# Patient Record
Sex: Male | Born: 1990 | Race: White | Hispanic: No | Marital: Single | Smoking: Former smoker
Health system: Southern US, Community
[De-identification: ages and names within clinical notes are randomized; demographics above are authoritative.]

## PROBLEM LIST (undated history)

## (undated) DIAGNOSIS — F329 Major depressive disorder, single episode, unspecified: Secondary | ICD-10-CM

## (undated) DIAGNOSIS — F419 Anxiety disorder, unspecified: Secondary | ICD-10-CM

## (undated) DIAGNOSIS — F1910 Other psychoactive substance abuse, uncomplicated: Secondary | ICD-10-CM

## (undated) DIAGNOSIS — F32A Depression, unspecified: Secondary | ICD-10-CM

## (undated) HISTORY — PX: DENTAL SURGERY: SHX609

## (undated) HISTORY — DX: Anxiety disorder, unspecified: F41.9

## (undated) HISTORY — DX: Other psychoactive substance abuse, uncomplicated: F19.10

## (undated) HISTORY — DX: Depression, unspecified: F32.A

## (undated) HISTORY — DX: Major depressive disorder, single episode, unspecified: F32.9

---
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
## 2010-03-04 ENCOUNTER — Encounter: Payer: Self-pay | Admitting: Family Medicine

## 2010-12-14 NOTE — Letter (Signed)
Summary: Immunization Form  Immunization Form   Imported By: Lanelle Bal 03/16/2010 11:46:52  _____________________________________________________________________  External Attachment:    Type:   Image     Comment:   External Document

## 2010-12-14 NOTE — Miscellaneous (Signed)
Summary: Vaccine Records  Vaccine Records   Imported By: Lanelle Bal 03/16/2010 11:45:44  _____________________________________________________________________  External Attachment:    Type:   Image     Comment:   External Document

## 2011-11-18 ENCOUNTER — Encounter: Payer: Self-pay | Admitting: Emergency Medicine

## 2011-11-18 ENCOUNTER — Emergency Department (HOSPITAL_COMMUNITY)
Admission: EM | Admit: 2011-11-18 | Discharge: 2011-11-18 | Disposition: A | Discharge status: Home / Self Care | Payer: BC Managed Care – PPO | Attending: Emergency Medicine | Admitting: Emergency Medicine

## 2011-11-18 DIAGNOSIS — IMO0002 Reserved for concepts with insufficient information to code with codable children: Secondary | ICD-10-CM

## 2011-11-18 DIAGNOSIS — Y92009 Unspecified place in unspecified non-institutional (private) residence as the place of occurrence of the external cause: Secondary | ICD-10-CM | POA: Insufficient documentation

## 2011-11-18 DIAGNOSIS — S0180XA Unspecified open wound of other part of head, initial encounter: Secondary | ICD-10-CM | POA: Insufficient documentation

## 2011-11-18 MED ORDER — IBUPROFEN 600 MG PO TABS: 600.0000 mg | ORAL_TABLET | Freq: Four times a day (QID) | ORAL | Status: AC | PRN

## 2011-11-18 MED ORDER — TETANUS-DIPHTH-ACELL PERTUSSIS 5-2.5-18.5 LF-MCG/0.5 IM SUSP
0.5000 mL | Freq: Once | INTRAMUSCULAR | Status: AC
  Administered 2011-11-18: 0.5 mL via INTRAMUSCULAR
  Filled 2011-11-18: qty 0.5

## 2011-11-18 NOTE — ED Notes (Signed)
LACERATION CLEANED WITH NS AND DRY DRESSING APPLIED AT TRIAGE.

## 2011-11-18 NOTE — ED Notes (Signed)
PT. PRESENTS WITH LACERATION AT UPPER FOREHEAD APPROX. 1 INCH , PT. UNABLE TO PROVIDE INFORMATION ON INCIDENT , PT. WITH [employer] OFFICER UNDER CUSTODY.

## 2011-11-18 NOTE — ED Provider Notes (Signed)
History     CSN: [id number]  Arrival date & time 11/18/11  0865   First MD Initiated Contact with Patient 11/18/11 [phone]      Chief Complaint  Patient presents with  . Head Laceration    (Consider location/radiation/quality/duration/timing/severity/associated sxs/prior treatment) Patient is a 21 y.o. male presenting with scalp laceration. The history is provided by the patient.  Head Laceration This is a new problem. The current episode started 1 to 2 hours ago. The problem occurs constantly. The problem has not changed since onset.Pertinent negatives include no chest pain, no abdominal pain, no headaches and no shortness of breath. The symptoms are aggravated by nothing. The symptoms are relieved by nothing. He has tried nothing for the symptoms. The treatment provided no relief.   patient brought in under custody of police. Patient tells me he was at home when the police arrived and arrested him. His friend was also arrested and was resisting arrest and was tackled by police allegedly. During this event the patient was also tackled and he believes at some point this is what caused laceration to his for head. He denies any LOC or neck pain. He denies any other pain injury or trauma. Last tetanus was about 5 years ago. Bleeding controlled prior to arrival. Her troubles vision. No nausea vomiting. No foreign body sensation.  History reviewed. No pertinent past medical history.  History reviewed. No pertinent past surgical history.  No family history on file.  History  Substance Use Topics  . Smoking status: Current Everyday Smoker  . Smokeless tobacco: Not on file  . Alcohol Use: No      Review of Systems  Constitutional: Negative for fever and chills.  HENT: Negative for neck pain and neck stiffness.   Eyes: Negative for pain.  Respiratory: Negative for shortness of breath.   Cardiovascular: Negative for chest pain.  Gastrointestinal: Negative for abdominal pain.    Genitourinary: Negative for dysuria.  Musculoskeletal: Negative for back pain.  Skin: Positive for wound. Negative for rash.  Neurological: Negative for headaches.  All other systems reviewed and are negative.    Allergies  Review of patient's allergies indicates no known allergies.  Home Medications  No current outpatient prescriptions on file.  BP 134/74  Pulse 80  Temp(Src) 97.2 F (36.2 C) (Oral)  Resp 18  SpO2 98%  Physical Exam  Constitutional: He is oriented to person, place, and time. He appears well-developed and well-nourished.  HENT:  Head: Normocephalic.       3 cm U-shaped laceration midline just below the hairline. Full-thickness. No active bleeding. No foreign bodies visualized. No underlying bony tenderness or deformity. No associated epistaxis or septal hematoma. No trismus. No midface instability.  Eyes: Conjunctivae and EOM are normal. Pupils are equal, round, and reactive to light.  Neck: Full passive range of motion without pain. Neck supple. No thyromegaly present.       No cervical tenderness or deformity.  Cardiovascular: Normal rate, regular rhythm, S1 normal, S2 normal and intact distal pulses.   Pulmonary/Chest: Effort normal and breath sounds normal.  Abdominal: Soft. Bowel sounds are normal. There is no tenderness. There is no CVA tenderness.  Musculoskeletal: Normal range of motion.  Neurological: He is alert and oriented to person, place, and time. He has normal strength and normal reflexes. No cranial nerve deficit or sensory deficit. He displays a negative Romberg sign. GCS eye subscore is 4. GCS verbal subscore is 5. GCS motor subscore is 6.  Normal Gait  Skin: Skin is warm and dry. No rash noted. No cyanosis. Nails show no clubbing.  Psychiatric: He has a normal mood and affect. His speech is normal and behavior is normal.    ED Course  LACERATION REPAIR Date/Time: 11/18/2011 6:28 AM Performed by: Sunnie Nielsen Authorized by: Sunnie Nielsen Consent: Verbal consent obtained. Risks and benefits: risks, benefits and alternatives were discussed Consent given by: patient Patient understanding: patient states understanding of the procedure being performed Patient consent: the patient's understanding of the procedure matches consent given Procedure consent: procedure consent matches procedure scheduled Patient identity confirmed: verbally with patient Time out: Immediately prior to procedure a "time out" was called to verify the correct patient, procedure, equipment, support staff and site/side marked as required. Body area: head/neck Location details: forehead Laceration length: 3 cm Foreign bodies: no foreign bodies Tendon involvement: none Nerve involvement: none Vascular damage: no Anesthesia: local infiltration Local anesthetic: lidocaine 1% without epinephrine Anesthetic total: 2 ml Preparation: Patient was prepped and draped in the usual sterile fashion. Irrigation solution: saline Irrigation method: syringe Amount of cleaning: extensive Debridement: none Degree of undermining: none Skin closure: 6-0 Prolene Number of sutures: 3 Technique: simple Approximation: close Approximation difficulty: simple Dressing: 4x4 sterile gauze and antibiotic ointment Patient tolerance: Patient tolerated the procedure well with no immediate complications. Comments: Tetanus updated. Wound edges well approximated.   (including critical care time)  lac repair as above   MDM   Head wound. No indication for CT brain.   plan suture removal 7 days.  Scar and infection precautions verbalized is understood.   Sunnie Nielsen, MD 11/18/11 [phone]

## 2012-03-23 ENCOUNTER — Emergency Department (HOSPITAL_COMMUNITY): Payer: BC Managed Care – PPO

## 2012-03-23 ENCOUNTER — Emergency Department (HOSPITAL_COMMUNITY)
Admission: EM | Admit: 2012-03-23 | Discharge: 2012-03-23 | Disposition: A | Discharge status: Home / Self Care | Payer: BC Managed Care – PPO | Attending: Emergency Medicine | Admitting: Emergency Medicine

## 2012-03-23 DIAGNOSIS — R4182 Altered mental status, unspecified: Secondary | ICD-10-CM | POA: Insufficient documentation

## 2012-03-23 DIAGNOSIS — F1010 Alcohol abuse, uncomplicated: Secondary | ICD-10-CM | POA: Insufficient documentation

## 2012-03-23 DIAGNOSIS — F10929 Alcohol use, unspecified with intoxication, unspecified: Secondary | ICD-10-CM

## 2012-03-23 DIAGNOSIS — R112 Nausea with vomiting, unspecified: Secondary | ICD-10-CM | POA: Insufficient documentation

## 2012-03-23 DIAGNOSIS — F17200 Nicotine dependence, unspecified, uncomplicated: Secondary | ICD-10-CM | POA: Insufficient documentation

## 2012-03-23 LAB — POCT I-STAT, CHEM 8
Calcium, Ion: 1.21 mmol/L (ref 1.12–1.32)
Chloride: 104 mEq/L (ref 96–112)
HCT: 43 % (ref 39.0–52.0)
Hemoglobin: 14.6 g/dL (ref 13.0–17.0)
Potassium: 3.5 mEq/L (ref 3.5–5.1)

## 2012-03-23 LAB — RAPID URINE DRUG SCREEN, HOSP PERFORMED
Barbiturates: NOT DETECTED
Cocaine: NOT DETECTED
Tetrahydrocannabinol: POSITIVE — AB

## 2012-03-23 MED ORDER — ONDANSETRON HCL 4 MG/2ML IJ SOLN
4.0000 mg | Freq: Once | INTRAMUSCULAR | Status: AC
  Administered 2012-03-23: 4 mg via INTRAVENOUS
  Filled 2012-03-23: qty 2

## 2012-03-23 MED ORDER — ONDANSETRON HCL 4 MG PO TABS: 4.0000 mg | ORAL_TABLET | Freq: Four times a day (QID) | ORAL | Status: AC

## 2012-03-23 MED ORDER — SODIUM CHLORIDE 0.9 % IV BOLUS (SEPSIS)
500.0000 mL | Freq: Once | INTRAVENOUS | Status: AC
  Administered 2012-03-23: 500 mL via INTRAVENOUS

## 2012-03-23 MED ORDER — AMMONIA AROMATIC IN INHA
0.3000 mL | Freq: Once | RESPIRATORY_TRACT | Status: AC
  Administered 2012-03-23: 0.3 mL via RESPIRATORY_TRACT
  Filled 2012-03-23: qty 10

## 2012-03-23 NOTE — ED Notes (Addendum)
Patient briefly grimaced, but he did not withdraw from the ammonia inhalant.

## 2012-03-23 NOTE — ED Notes (Signed)
Per E.M.S., the patient was alert on transport.  At this time he is not alert, and he only localizes pain associated with sternal rubs.

## 2012-03-23 NOTE — ED Provider Notes (Signed)
Medical screening examination/treatment/procedure(s) were performed by non-physician practitioner and as supervising physician I was immediately available for consultation/collaboration.   Glynn Octave, MD 03/23/12 [phone]

## 2012-03-23 NOTE — ED Notes (Signed)
Patient transported to CT 

## 2012-03-23 NOTE — ED Notes (Signed)
The patient advised E.M.S. that he started drinking alcohol at 2300.  The patient stated that he didn't know how much alcohol he ingested.  E.M.S. reported vomiting x 1 en route.  Patient smells of emesis.

## 2012-03-23 NOTE — Discharge Instructions (Signed)
Alcohol Intoxication  You have alcohol intoxication when the amount of alcohol that you have consumed has impaired your ability to mentally and physically function. There are a variety of factors that contribute to the level at which alcohol intoxication can occur, such as age, gender, weight, frequency of alcohol consumption, medication use, and the presence of other medical conditions, such as diabetes, seizures, or heart conditions.  The blood alcohol level test measures the concentration of alcohol in your blood. In most states, your blood alcohol level must be lower than 80 mg/dL (0.08%) to legally drive. However, many dangerous effects of alcohol can occur at much lower levels.  Alcohol directly impairs the normal chemical activity of the brain and is said to be a chemical depressant. Alcohol can cause drowsiness, stupor, respiratory failure, and coma. Other physical effects can include headache, vomiting, vomiting of blood, abdominal pain, a fast heartbeat, difficulty breathing, anxiety, and amnesia. Alcohol intoxication can also lead to dangerous and life-threatening activities, such as fighting, dangerous operation of vehicles or heavy machinery, and risky sexual behavior.  Alcohol can be especially dangerous when taken with other drugs. Some of these drugs are:   Sedatives.   Painkillers.   Marijuana.   Tranquilizers.   Antihistamines.   Muscle relaxants.   Seizure medicine.  Many of the effects of acute alcohol intoxication are temporary. However, repeated alcohol intoxication can lead to severe medical illnesses. If you have alcohol intoxication, you should:   Stay hydrated. Drink enough water and fluids to keep your urine clear or pale yellow. Avoid excessive caffeine because this can further lead to dehydration.   Eat a healthy diet. You may have residual nausea, headache, and loss of appetite, but it is still important that you maintain good nutrition. You can start with clear  liquids.   Take nonsteroidal anti-inflammatory medications as needed for headaches, but make sure to do so with small meals. You should avoid acetaminophen for several days after having alcohol intoxication because the combination of alcohol and acetaminophen can be toxic to your liver.  If you have frequent alcohol intoxication, ask your friends and family if they think you have a drinking problem. For further help, contact:   Your caregiver.   Alcoholics Anonymous (AA).   A drug or alcohol rehabilitation program.  SEEK MEDICAL CARE IF:    You have persistent vomiting.   You have persistent pain in any part of your body.   You do not feel better after a few days.  SEEK IMMEDIATE MEDICAL CARE IF:    You become shaky or tremble when you try to stop drinking.   You shake uncontrollably (seizure).   You throw up (vomit) blood. This may be bright red or it may look like black coffee grounds.   You have blood in the stool. This may be bright red or appear as a black, tarry, bad smelling stool.   You become lightheaded or faint.  ANY OF THESE SYMPTOMS MAY REPRESENT A SERIOUS PROBLEM THAT IS AN EMERGENCY. Do not wait to see if the symptoms will go away. Get medical help right away. Call your local emergency services (911 in [place] DO NOT drive yourself to the hospital.  MAKE SURE YOU:    Understand these instructions.   Will watch your condition.   Will get help right away if you are not doing well or get worse.  Document Released: 08/10/2005 Document Revised: 10/20/2011 Document Reviewed: 04/19/2010  [employer] Patient Information 2012 [employer].

## 2012-03-23 NOTE — ED Provider Notes (Signed)
History     CSN: [id number]  Arrival date & time 03/23/12  0551   None     Chief Complaint  Patient presents with  . Altered Mental Status  . Emesis  . Alcohol Intoxication    (Consider location/radiation/quality/duration/timing/severity/associated sxs/prior treatment) Patient is a 21 y.o. male presenting with intoxication. The history is provided by the EMS personnel.  Alcohol Intoxication This is a new problem. The current episode started yesterday. The problem occurs constantly. Associated symptoms comments: Brought in by EMS, unable to give history secondary to significant alcohol intoxication. Per EMS, patient vomiting after drinking last night. No known or reported injuries..    No past medical history on file.  No past surgical history on file.  No family history on file.  History  Substance Use Topics  . Smoking status: Current Everyday Smoker  . Smokeless tobacco: Not on file  . Alcohol Use: No      Review of Systems  Unable to perform ROS   Allergies  Review of patient's allergies indicates no known allergies.  Home Medications  No current outpatient prescriptions on file.  BP 117/68  Pulse 69  Temp(Src) 98 F (36.7 C) (Axillary)  Resp 18  Ht 5\' 8"  (1.727 m)  Wt 140 lb (63.504 kg)  BMI 21.29 kg/m2  SpO2 99%  Physical Exam  Constitutional: He appears well-developed and well-nourished.  Eyes: Pupils are equal, round, and reactive to light.  Cardiovascular: Normal rate.   No murmur heard. Pulmonary/Chest: No respiratory distress. He has no wheezes. He has no rales.  Abdominal: Soft.  Neurological:       Patient responds to sternal rub, speaks incoherently, then sleeps again. VSS.    ED Course  Procedures (including critical care time)   Labs Reviewed  ETHANOL  URINE RAPID DRUG SCREEN (HOSP PERFORMED)   No results found.  Results for orders placed during the hospital encounter of 03/23/12  ETHANOL      Component Value Range   Alcohol, Ethyl (B) 206 (*) 0 - 11 (mg/dL)  URINE RAPID DRUG SCREEN (HOSP PERFORMED)      Component Value Range   Opiates NONE DETECTED  NONE DETECTED    Cocaine NONE DETECTED  NONE DETECTED    Benzodiazepines POSITIVE (*) NONE DETECTED    Amphetamines NONE DETECTED  NONE DETECTED    Tetrahydrocannabinol POSITIVE (*) NONE DETECTED    Barbiturates NONE DETECTED  NONE DETECTED   POCT I-STAT, CHEM 8      Component Value Range   Sodium 142  135 - 145 (mEq/L)   Potassium 3.5  3.5 - 5.1 (mEq/L)   Chloride 104  96 - 112 (mEq/L)   BUN 10  6 - 23 (mg/dL)   Creatinine, Ser 1.19  0.50 - 1.35 (mg/dL)   Glucose, Bld 87  70 - 99 (mg/dL)   Calcium, Ion 1.47  8.29 - 1.32 (mmol/L)   TCO2 27  0 - 100 (mmol/L)   Hemoglobin 14.6  13.0 - 17.0 (g/dL)   HCT 56.2  13.0 - 86.5 (%)   Ct Head Wo Contrast  03/23/2012  *RADIOLOGY REPORT*  Clinical Data: Altered mental status.  Emesis.  CT HEAD WITHOUT CONTRAST  Technique:  Contiguous axial images were obtained from the base of the skull through the vertex without contrast.  Comparison: None.  Findings: No acute cortical infarct, hemorrhage, or mass lesion is present.  The ventricles are of normal size.  No significant extra- axial fluid collection is present.  The  paranasal sinuses and mastoid air cells are clear.  The osseous skull is intact.  IMPRESSION: Negative CT of the head.  Original Report Authenticated By: Jamesetta Orleans. MATTERN, M.D.   No diagnosis found.  1. Alcohol intoxication   MDM  Patient now waking to verbal stimuli after initially only with painful stimuli. VSS - will continue to monitor.  Multiple re-evaluations: Patient always wakens to verbal command, gradually more and more awake. Vital signs stable for duration of visit. Father at bedside. Patient (at 1245 p.m.) is fully awake and oriented. Will discharge home.        Rodena Medin, PA-C 03/23/12 1250

## 2014-10-26 ENCOUNTER — Emergency Department (HOSPITAL_COMMUNITY): Payer: BC Managed Care – PPO

## 2014-10-26 ENCOUNTER — Emergency Department (HOSPITAL_COMMUNITY)
Admission: EM | Admit: 2014-10-26 | Discharge: 2014-10-26 | Disposition: A | Discharge status: Home / Self Care | Payer: BC Managed Care – PPO | Attending: Emergency Medicine | Admitting: Emergency Medicine

## 2014-10-26 ENCOUNTER — Encounter (HOSPITAL_COMMUNITY): Payer: Self-pay

## 2014-10-26 DIAGNOSIS — S61512A Laceration without foreign body of left wrist, initial encounter: Secondary | ICD-10-CM | POA: Diagnosis not present

## 2014-10-26 DIAGNOSIS — Y998 Other external cause status: Secondary | ICD-10-CM | POA: Insufficient documentation

## 2014-10-26 DIAGNOSIS — Z720 Tobacco use: Secondary | ICD-10-CM | POA: Insufficient documentation

## 2014-10-26 DIAGNOSIS — Y9389 Activity, other specified: Secondary | ICD-10-CM | POA: Diagnosis not present

## 2014-10-26 DIAGNOSIS — S50311A Abrasion of right elbow, initial encounter: Secondary | ICD-10-CM | POA: Diagnosis not present

## 2014-10-26 DIAGNOSIS — S8392XA Sprain of unspecified site of left knee, initial encounter: Secondary | ICD-10-CM | POA: Diagnosis not present

## 2014-10-26 DIAGNOSIS — T1490XA Injury, unspecified, initial encounter: Secondary | ICD-10-CM

## 2014-10-26 DIAGNOSIS — Z23 Encounter for immunization: Secondary | ICD-10-CM | POA: Insufficient documentation

## 2014-10-26 DIAGNOSIS — F10920 Alcohol use, unspecified with intoxication, uncomplicated: Secondary | ICD-10-CM

## 2014-10-26 DIAGNOSIS — F10120 Alcohol abuse with intoxication, uncomplicated: Secondary | ICD-10-CM | POA: Diagnosis not present

## 2014-10-26 DIAGNOSIS — Y92410 Unspecified street and highway as the place of occurrence of the external cause: Secondary | ICD-10-CM | POA: Insufficient documentation

## 2014-10-26 LAB — COMPREHENSIVE METABOLIC PANEL
ALK PHOS: 66 U/L (ref 39–117)
ALT: 43 U/L (ref 0–53)
ANION GAP: 14 (ref 5–15)
AST: 55 U/L — ABNORMAL HIGH (ref 0–37)
Albumin: 4.1 g/dL (ref 3.5–5.2)
BILIRUBIN TOTAL: 0.3 mg/dL (ref 0.3–1.2)
BUN: 9 mg/dL (ref 6–23)
CHLORIDE: 102 meq/L (ref 96–112)
CO2: 24 mEq/L (ref 19–32)
CREATININE: 0.92 mg/dL (ref 0.50–1.35)
Calcium: 8.9 mg/dL (ref 8.4–10.5)
GFR calc non Af Amer: >90 mL/min (ref >90)
GLUCOSE: 94 mg/dL (ref 70–99)
POTASSIUM: 4.1 meq/L (ref 3.7–5.3)
Sodium: 140 mEq/L (ref 137–147)
TOTAL PROTEIN: 6.8 g/dL (ref 6.0–8.3)

## 2014-10-26 LAB — PROTIME-INR
INR: 0.96 (ref 0.00–1.49)
PROTHROMBIN TIME: 12.9 s (ref 11.6–15.2)

## 2014-10-26 LAB — CBC
HCT: 43.3 % (ref 39.0–52.0)
Hemoglobin: 14.2 g/dL (ref 13.0–17.0)
MCH: 29.6 pg (ref 26.0–34.0)
MCHC: 32.8 g/dL (ref 30.0–36.0)
MCV: 90.2 fL (ref 78.0–100.0)
PLATELETS: 235 10*3/uL (ref 150–400)
RBC: 4.8 MIL/uL (ref 4.22–5.81)
RDW: 13.3 % (ref 11.5–15.5)
WBC: 9.5 10*3/uL (ref 4.0–10.5)

## 2014-10-26 LAB — SAMPLE TO BLOOD BANK

## 2014-10-26 LAB — ETHANOL: ALCOHOL ETHYL (B): 278 mg/dL — AB (ref 0–11)

## 2014-10-26 MED ORDER — TRAMADOL HCL 50 MG PO TABS: 50.0000 mg | ORAL_TABLET | Freq: Four times a day (QID) | ORAL | Status: DC | PRN

## 2014-10-26 MED ORDER — TETANUS-DIPHTH-ACELL PERTUSSIS 5-2.5-18.5 LF-MCG/0.5 IM SUSP
0.5000 mL | Freq: Once | INTRAMUSCULAR | Status: AC
  Administered 2014-10-26: 0.5 mL via INTRAMUSCULAR
  Filled 2014-10-26: qty 0.5

## 2014-10-26 MED ORDER — IOHEXOL 300 MG/ML  SOLN
100.0000 mL | Freq: Once | INTRAMUSCULAR | Status: AC | PRN
  Administered 2014-10-26: 100 mL via INTRAVENOUS
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
## 2014-10-31 ENCOUNTER — Ambulatory Visit (INDEPENDENT_AMBULATORY_CARE_PROVIDER_SITE_OTHER): Payer: BC Managed Care – PPO | Admitting: Medical

## 2014-10-31 ENCOUNTER — Encounter: Payer: Self-pay | Admitting: Medical

## 2014-10-31 ENCOUNTER — Ambulatory Visit (HOSPITAL_BASED_OUTPATIENT_CLINIC_OR_DEPARTMENT_OTHER)
Admission: RE | Admit: 2014-10-31 | Discharge: 2014-10-31 | Disposition: A | Discharge status: Home / Self Care | Payer: BC Managed Care – PPO | Source: Ambulatory Visit | Attending: Medical | Admitting: Medical

## 2014-10-31 VITALS — BP 135/77 | HR 82 | Temp 98.7°F | Ht 70.0 in | Wt 170.6 lb

## 2014-10-31 DIAGNOSIS — S41119A Laceration without foreign body of unspecified upper arm, initial encounter: Secondary | ICD-10-CM | POA: Insufficient documentation

## 2014-10-31 DIAGNOSIS — F1910 Other psychoactive substance abuse, uncomplicated: Secondary | ICD-10-CM | POA: Insufficient documentation

## 2014-10-31 DIAGNOSIS — M25562 Pain in left knee: Secondary | ICD-10-CM | POA: Insufficient documentation

## 2014-10-31 DIAGNOSIS — M25552 Pain in left hip: Secondary | ICD-10-CM | POA: Insufficient documentation

## 2014-10-31 DIAGNOSIS — M79659 Pain in unspecified thigh: Secondary | ICD-10-CM | POA: Insufficient documentation

## 2014-10-31 DIAGNOSIS — S41112S Laceration without foreign body of left upper arm, sequela: Secondary | ICD-10-CM

## 2014-10-31 DIAGNOSIS — M79652 Pain in left thigh: Secondary | ICD-10-CM

## 2014-10-31 MED ORDER — TRAMADOL HCL 50 MG PO TABS: 50.0000 mg | ORAL_TABLET | Freq: Three times a day (TID) | ORAL | Status: DC | PRN

## 2014-10-31 MED ORDER — DICLOFENAC SODIUM 75 MG PO TBEC: 75.0000 mg | DELAYED_RELEASE_TABLET | Freq: Two times a day (BID) | ORAL | Status: DC

## 2014-10-31 MED ORDER — KETOROLAC TROMETHAMINE 60 MG/2ML IM SOLN
60.0000 mg | Freq: Once | INTRAMUSCULAR | Status: AC
  Administered 2014-10-31: 60 mg via INTRAMUSCULAR
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
## 2014-10-31 NOTE — Assessment & Plan Note (Addendum)
Regarding the left knee I will refer you to orthopedist since you may have had internal derangement.  Note patient states that his pain is severe at times and tramadol does not help. Pt has history of substance abuse. He states with xanax. So I am not going to go with stronger meds such as vicodin or percocet rather will advise him to take 2 of the tramadol q 6 hours if 1 not adequate. Also prescribing diclofenac to help with baseline pain and decrease use of tramadol. Toradol 60 mg im given in office today.

## 2014-10-31 NOTE — Patient Instructions (Addendum)
I will get a lt femur xray today to evaluate this are for fracture.  Regarding the left knee I will refer you to orthopedist since you may have had internal derangement.  For your left forearm/wrist laceration, I will remove those staples on Monday. Schedule nurse visit.  For your pain, I am prescribing tramadol 50 mg #30 1-2 tab po q 6 hours as needed for pain.  Diclofenac nsaid for pain and inflammation.  Follow up in 10 days.  Please make nurse office visit for Monday for removal of staples.(Pt made aware he still needs to come back in 10 days.  Wound today looked like it would be beneficial to wait couple more days.  Removed 2 staples right forearm on December 21st, 2015. I wanted him back today since looked like to early to remove when he was in on Friday. I will ask management not to charge today visit.

## 2014-10-31 NOTE — Progress Notes (Signed)
Pre visit review using our clinic review tool, if applicable. No additional management support is needed unless otherwise documented below in the visit note. 
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
## 2014-10-31 NOTE — Assessment & Plan Note (Signed)
I will get a lt femur xray today to evaluate this are for fracture.

## 2014-10-31 NOTE — Assessment & Plan Note (Signed)
He admis problems with xanax in the past. He does smoke marijuana.

## 2014-11-03 ENCOUNTER — Encounter: Payer: Self-pay | Admitting: Medical

## 2014-11-03 ENCOUNTER — Ambulatory Visit (INDEPENDENT_AMBULATORY_CARE_PROVIDER_SITE_OTHER): Payer: BC Managed Care – PPO | Admitting: Medical

## 2014-11-03 VITALS — BP 131/61 | HR 79 | Temp 98.3°F | Ht 70.0 in | Wt 169.0 lb

## 2014-11-03 DIAGNOSIS — Z4802 Encounter for removal of sutures: Secondary | ICD-10-CM

## 2014-11-03 NOTE — Progress Notes (Signed)
Pre visit review using our clinic review tool, if applicable. No additional management support is needed unless otherwise documented below in the visit note. 

## 2014-11-04 ENCOUNTER — Telehealth: Payer: Self-pay | Admitting: Medical

## 2014-11-04 NOTE — Telephone Encounter (Signed)
emmi emailed °

## 2014-12-10 ENCOUNTER — Encounter (HOSPITAL_BASED_OUTPATIENT_CLINIC_OR_DEPARTMENT_OTHER): Payer: Self-pay

## 2014-12-10 ENCOUNTER — Emergency Department (HOSPITAL_BASED_OUTPATIENT_CLINIC_OR_DEPARTMENT_OTHER)
Admission: EM | Admit: 2014-12-10 | Discharge: 2014-12-10 | Disposition: A | Discharge status: Home / Self Care | Payer: BLUE CROSS/BLUE SHIELD | Attending: Emergency Medicine | Admitting: Emergency Medicine

## 2014-12-10 DIAGNOSIS — M25462 Effusion, left knee: Secondary | ICD-10-CM | POA: Insufficient documentation

## 2014-12-10 DIAGNOSIS — Z720 Tobacco use: Secondary | ICD-10-CM | POA: Insufficient documentation

## 2014-12-10 DIAGNOSIS — Z8659 Personal history of other mental and behavioral disorders: Secondary | ICD-10-CM | POA: Insufficient documentation

## 2014-12-10 MED ORDER — IBUPROFEN 800 MG PO TABS: 800.0000 mg | ORAL_TABLET | Freq: Three times a day (TID) | ORAL | Status: DC

## 2014-12-10 NOTE — Discharge Instructions (Signed)
Knee Effusion Wear your knee brace when up and moving about. You may remove for sleep. Use crutches. Elevate your leg and ice the knee for the next 2 days and then if needed. Make an appointment with your orthopedic doctor as soon as possible.   The medical term for having fluid in your knee is effusion. This is often due to an internal derangement of the knee. This means something is wrong inside the knee. Some of the causes of fluid in the knee may be torn cartilage, a torn ligament, or bleeding into the joint from an injury. Your knee is likely more difficult to bend and move. This is often because there is increased pain and pressure in the joint. The time it takes for recovery from a knee effusion depends on different factors, including:   Type of injury.  Your age.  Physical and medical conditions.  Rehabilitation Strategies. How long you will be away from your normal activities will depend on what kind of knee problem you have and how much damage is present. Your knee has two types of cartilage. Articular cartilage covers the bone ends and lets your knee bend and move smoothly. Two menisci, thick pads of cartilage that form a rim inside the joint, help absorb shock and stabilize your knee. Ligaments bind the bones together and support your knee joint. Muscles move the joint, help support your knee, and take stress off the joint itself. CAUSES  Often an effusion in the knee is caused by an injury to one of the menisci. This is often a tear in the cartilage. Recovery after a meniscus injury depends on how much meniscus is damaged and whether you have damaged other knee tissue. Small tears may heal on their own with conservative treatment. Conservative means rest, limited weight bearing activity and muscle strengthening exercises. Your recovery may take up to 6 weeks.  TREATMENT  Larger tears may require surgery. Meniscus injuries may be treated during arthroscopy. Arthroscopy is a procedure in  which your surgeon uses a small telescope like instrument to look in your knee. Your caregiver can make a more accurate diagnosis (learning what is wrong) by performing an arthroscopic procedure. If your injury is on the inner margin of the meniscus, your surgeon may trim the meniscus back to a smooth rim. In other cases your surgeon will try to repair a damaged meniscus with stitches (sutures). This may make rehabilitation take longer, but may provide better long term result by helping your knee keep its shock absorption capabilities. Ligaments which are completely torn usually require surgery for repair. HOME CARE INSTRUCTIONS  Use crutches as instructed.  If a brace is applied, use as directed.  Once you are home, an ice pack applied to your swollen knee may help with discomfort and help decrease swelling.  Keep your knee raised (elevated) when you are not up and around or on crutches.  Only take over-the-counter or prescription medicines for pain, discomfort, or fever as directed by your caregiver.  Your caregivers will help with instructions for rehabilitation of your knee. This often includes strengthening exercises.  You may resume a normal diet and activities as directed. SEEK MEDICAL CARE IF:   There is increased swelling in your knee.  You notice redness, swelling, or increasing pain in your knee.  An unexplained oral temperature above 102 F (38.9 C) develops. SEEK IMMEDIATE MEDICAL CARE IF:   You develop a rash.  You have difficulty breathing.  You have any allergic reactions from  medications you may have been given.  There is severe pain with any motion of the knee. MAKE SURE YOU:   Understand these instructions.  Will watch your condition.  Will get help right away if you are not doing well or get worse. Document Released: 01/21/2004 Document Revised: 01/23/2012 Document Reviewed: 03/26/2008 [facility] Patient Information 2015 [place], [place]. This information  is not intended to replace advice given to you by your health care provider. Make sure you discuss any questions you have with your health care provider.

## 2014-12-10 NOTE — ED Notes (Signed)
Pt refused xray at this time-stating he does not have insurance at this time and prefers to wait until see EDP
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
## 2022-06-09 ENCOUNTER — Ambulatory Visit (HOSPITAL_COMMUNITY)
Admission: EM | Admit: 2022-06-09 | Discharge: 2022-06-09 | Disposition: A | Discharge status: Home / Self Care | Payer: No Payment, Other

## 2022-06-09 DIAGNOSIS — F1910 Other psychoactive substance abuse, uncomplicated: Secondary | ICD-10-CM

## 2022-06-09 NOTE — BH Assessment (Addendum)
Comprehensive Clinical Assessment (CCA) Screening, Triage and Referral Note  06/09/2022 Shalamar P Bumgarner [id number]  Chief Complaint:  Triage/Screening completed. Patient is routine. Please room.       Visit Diagnosis: Substance Use Disorder  Patient Reported Information How did you hear about [place]? Other (Comment) ([facility] in [employer])  What Is the Reason for Your Visit/Call Today? Ebenezer Lefkowitz is a 31 year old male that presents to the [facility], voluntarily. Referred by [facility] in [place], [place]. States that [facility] indicated that he needed to be seen here to make sure he is not in need of detox treatment. He uses alcohol, age of first use is 31 years old, drinks daily x3 months, average amount of use is 2 bottles of wine daily, and last use was 4 days ago. Also, uses benzodiazepines (Xanax) and it's not prescribed, age of first use was 31 years old,  typically uses 1x per week, 1-2 pills per use, and last use was 1 week ago. No withdrawal symptoms. No history of seizures, DT's, and/or blackouts. Hx of  substance use treatment 3x's. Last treatment was at [facility], March 2023, and stayed 28 days. No SI. No HI. No AVH's. Single, no children, lives alone (recently evicted), unemployed.  How Long Has This Been Causing You Problems? > than 6 months  What Do You Feel Would Help You the Most Today? No data recorded  Have You Recently Had Any Thoughts About Hurting Yourself? No  Are You Planning to Commit Suicide/Harm Yourself At This time? No   Have you Recently Had Thoughts About Hurting Someone Karolee Ohs? No  Are You Planning to Harm Someone at This Time? No  Explanation: No data recorded  Have You Used Any Alcohol or Drugs in the Past 24 Hours? No  How Long Ago Did You Use Drugs or Alcohol? No data recorded What Did You Use and How Much? n/a   Do You Currently Have a Therapist/Psychiatrist? No data recorded Name of Therapist/Psychiatrist: No data  recorded  Have You Been Recently Discharged From Any Office Practice or Programs? No data recorded Explanation of Discharge From Practice/Program: No data recorded   [place] of Residence: [place]  Patient Currently Receiving the Following Services: No  Determination of Need: Routine (7 days)   Options For Referral: Medication Management; Chemical Dependency Intensive Outpatient Therapy (CDIOP) (Residential Treatment at [facility])   Discharge Disposition: Disposition pending completion of the [facility] providers MSE.    Melynda Ripple, Counselor

## 2022-06-09 NOTE — BH Assessment (Addendum)
LCSW Progress Note   1343 - LCSW sent email to Danelle Berry @ [facility], [phone], [email or web address] to inform her that pt did not present with withdrawals symptoms, DT's, seizures, or blackouts.  Pt will be referred back to [facility] for residential substance use treatment.  LCSW sent clinical information to Danelle Berry at [facility] via secure email.   Hansel Starling, MSW, LCSW [facility] [phone] phone

## 2022-06-09 NOTE — Discharge Instructions (Addendum)
Please follow up with your appointment at [facility] on June 14, 2022 at 7:45AM. If you have questions, please reach out to [facility] at [phone]. Address: [address] [place], [place] [address].  Patient is instructed prior to discharge to: Take all medications as prescribed by his/her mental healthcare provider. Report any adverse effects and or reactions from the medicines to his/her outpatient provider promptly. Keep all scheduled appointments, to ensure that you are getting refills on time and to avoid any interruption in your medication.  If you are unable to keep an appointment call to reschedule.  Be sure to follow-up with resources and follow-up appointments provided.  Patient has been instructed & cautioned: To not engage in alcohol and or illegal drug use while on prescription medicines. In the event of worsening symptoms, patient is instructed to call the crisis hotline, 911 and or go to the nearest ED for appropriate evaluation and treatment of symptoms. To follow-up with his/her primary care provider for your other medical issues, concerns and or health care needs.

## 2022-06-09 NOTE — ED Provider Notes (Addendum)
Behavioral Health Urgent Care Medical Screening Exam  Patient Name: Malik Moody MRN: [id number] Date of Evaluation: 06/09/22 Chief Complaint:   Diagnosis:  Final diagnoses:  Polysubstance abuse (HCC)   History of Present illness: Malik Moody is a 31 y.o. male. Pt presents voluntarily to [facility] behavioral health for walk-in assessment.  Pt is assessed face-to-face by nurse practitioner.   Pt reports he is presenting today because "I need to get evaluated for need for detox for [facility]". Pt reports he uses alcohol, benzodiazepines, marijuana, and nicotine.   Pt reports he has been drinking about 2 bottles of wine/day. States he has not had any alcohol since Sunday, June 05, 2022. Reports on Monday to Tuesday night, he was shaky, experienced anxiety, increased bowel movements, abd pain, and sweating.   Pt reports he has been using benzodiazepines once or twice/month. His last use was a week ago. He reports use of 2 to 4mg  per occasion.  Pt reports use of marijuana daily. He is unable to estimate amount used as he uses cartridges and vapes. He reports last use was on Sunday.  Pt reports use of nicotine daily. He is unable to estimate amount used as he uses cartridges and vapes. He reports last use was on Monday or Tuesday.   Pt currently denies any withdrawal sx. He denies fever, chills, malaise/fatigue, blurry vision, double vision, chest pain, palpitations, shortness of breath, abd pain, n/v/d/c, myalgias, HA, dizziness, tingling, tremor, sensory changes, weakness, seizures, loc. Pt denies hx of seizures or DTs.   Pt reports his current mood is "good". He reports good appetite and fair sleep. He denies suicidal ideations, homicidal ideations or violent ideations. He denies auditory visual hallucinations or paranoia.   Pt is not currently connected w/ counseling or psychiatric medication management.  Pt states he is not current prescribed any medical or psychiatric  medications.  Pt reports he is living alone.  Pt reports he is unemployed.   Pt's highest degree of education is Thursday.  Pt denies hx of SA, NSSI, or inpatient psychiatric hospitalization.  Pt reports he has been to [facility] residential in the past, most recently 4 or 5 months ago, he completed 28 day residential program.   Pt gave verbal consent for writer to call [facility] on his behalf. [facility] was called and pt was scheduled for appointment for 06/14/22 at 7:45AM.   Pt denies any upcoming court dates or doctor's visits. He denies he is taking insulin or any injection medications. Pt is not prescribed suboxone or methadone.   Pt is a&ox3, in no acute distress, non-toxic appearing. He appears casually dressed, fairly groomed, appropriate for environment. Eye contact is good. Speech is clear and coherent, w/ nml rate and volume. Reported mood is euthymic. Affect is congruent, appropriate. TP is coherent, goal directed, linear. Description of associations is intact. TC is logical. There is no evidence of responding to internal stimuli, agitation, aggression or distractibility. No delusions or paranoia elicited. Pt is calm, cooperative, pleasant.  Psychiatric Specialty Exam  Presentation  General Appearance:Appropriate for Environment; Casual; Fairly Groomed  Eye Contact:Good  Speech:Clear and Coherent; Normal Rate  Speech Volume:Normal  Handedness:No data recorded  Mood and Affect  Mood:Euthymic Affect:Congruent; Appropriate  Thought Process  Thought Processes:Coherent; Goal Directed; Linear Descriptions of Associations:Intact  Orientation:Full (Time, Place and Person)  Thought Content:Logical    Hallucinations:None  Ideas of Reference:None  Suicidal Thoughts:No  Homicidal Thoughts:No   Sensorium  Memory:Immediate Good; Recent Good; Remote Good Judgment:Good  Insight:Good  Executive Functions  Concentration:Good Attention Span:Good Recall:Good Fund  of Knowledge:Good Language:Good  Psychomotor Activity  Psychomotor Activity:Normal  Assets  Assets:Communication Skills; Desire for Improvement  Sleep  Sleep:Fair Number of hours: No data recorded  No data recorded  Physical Exam: Physical Exam Constitutional:      Appearance: Normal appearance.  Cardiovascular:     Rate and Rhythm: Normal rate.  Pulmonary:     Effort: Pulmonary effort is normal.  Neurological:     Mental Status: He is alert and oriented to person, place, and time.  Psychiatric:        Attention and Perception: Attention and perception normal.        Mood and Affect: Mood and affect normal.        Speech: Speech normal.        Behavior: Behavior normal. Behavior is cooperative.        Thought Content: Thought content normal.        Cognition and Memory: Cognition and memory normal.        Judgment: Judgment normal.    Review of Systems  Constitutional:  Negative for chills, diaphoresis, fever and malaise/fatigue.  Eyes:  Negative for blurred vision and double vision.  Respiratory:  Negative for shortness of breath.   Cardiovascular:  Negative for chest pain and palpitations.  Gastrointestinal:  Negative for abdominal pain, constipation, diarrhea, nausea and vomiting.  Musculoskeletal:  Negative for myalgias.  Neurological:  Negative for dizziness, tingling, tremors, sensory change, seizures, loss of consciousness, weakness and headaches.  Psychiatric/Behavioral:  Positive for substance abuse. Negative for depression, hallucinations, memory loss and suicidal ideas. The patient is not nervous/anxious and does not have insomnia.    Blood pressure 126/78, pulse 88, temperature 98.4 F (36.9 C), temperature source Oral, resp. rate 18, SpO2 97 %. There is no height or weight on file to calculate BMI.  Musculoskeletal: Strength & Muscle Tone: within normal limits Gait & Station: normal Patient leans: N/A   [facility] MSE Discharge Disposition for Follow up  and Recommendations: Based on my evaluation the patient does not appear to have an emergency medical condition and can be discharged with resources and follow up care in outpatient services for substance abuse treatment.  Lauree Chandler, NP 06/09/2022, 2:24 PM

## 2022-07-11 ENCOUNTER — Ambulatory Visit: Payer: Self-pay | Admitting: Physician Assistant

## 2022-07-11 VITALS — BP 129/82 | HR 74 | Resp 18 | Ht 69.0 in | Wt 200.0 lb

## 2022-07-11 DIAGNOSIS — F419 Anxiety disorder, unspecified: Secondary | ICD-10-CM

## 2022-07-11 DIAGNOSIS — B360 Pityriasis versicolor: Secondary | ICD-10-CM

## 2022-07-11 DIAGNOSIS — F5104 Psychophysiologic insomnia: Secondary | ICD-10-CM

## 2022-07-11 DIAGNOSIS — F1110 Opioid abuse, uncomplicated: Secondary | ICD-10-CM

## 2022-07-11 DIAGNOSIS — F1310 Sedative, hypnotic or anxiolytic abuse, uncomplicated: Secondary | ICD-10-CM

## 2022-07-11 DIAGNOSIS — F1510 Other stimulant abuse, uncomplicated: Secondary | ICD-10-CM

## 2022-07-11 DIAGNOSIS — F1010 Alcohol abuse, uncomplicated: Secondary | ICD-10-CM

## 2022-07-11 MED ORDER — KETOCONAZOLE 2 % EX CREA: 1.0000 | TOPICAL_CREAM | Freq: Every day | CUTANEOUS | 0 refills | Status: DC

## 2022-07-11 MED ORDER — QUETIAPINE FUMARATE 50 MG PO TABS: 50.0000 mg | ORAL_TABLET | Freq: Every day | ORAL | 1 refills | Status: AC

## 2022-07-11 NOTE — Progress Notes (Unsigned)
New Patient Office Visit  Subjective    Patient ID: Malik Moody, male    DOB: June 07, 1991  Age: 31 y.o. MRN: [id number]  CC:  Chief Complaint  Patient presents with   Medication Refill   Rash    Sweat rash, all over back, neck. Pt has been using hydrocortisone. Started about 30 days ago    Insomnia    HPI Mickie P Mcghee states that he is currently being treated for substance abuse at [facility] residential treatment center, states that he arrived on August 1 and will be transferring to [facility] in 2 days for long-term care.  States that he has been having difficulty sleeping both falling asleep and staying asleep.  States that he has been using melatonin and Benadryl without relief, states that he has tried trazodone in the past without relief.  States that he has used Seroquel in the past with relief.  States that he has a history of "sweat rash" states that it has been present on his neck and upper back since he has been at [facility].  Does endorse feelings of anxiety and states that he "sweats when he gets anxious" states that when he is alone he does not feel sweaty.  Does endorse different detergents, different soaps than he is used to.  States that he has been using hydrocortisone on it without relief.  Does describe it as itchy.  Outpatient Encounter Medications as of 07/11/2022  Medication Sig   ketoconazole (NIZORAL) 2 % cream Apply 1 Application topically daily.   QUEtiapine (SEROQUEL) 50 MG tablet Take 1 tablet (50 mg total) by mouth at bedtime.   ibuprofen (ADVIL,MOTRIN) 800 MG tablet Take 800 mg by mouth every 8 (eight) hours as needed.   ibuprofen (ADVIL,MOTRIN) 800 MG tablet Take 1 tablet (800 mg total) by mouth 3 (three) times daily.   No facility-administered encounter medications on file as of 07/11/2022.    Past Medical History:  Diagnosis Date   Anxiety    Depression    Substance abuse (HCC)     Past Surgical History:  Procedure Laterality Date   DENTAL  SURGERY      History reviewed. No pertinent family history.  Social History   Socioeconomic History   Marital status: Single    Spouse name: Not on file   Number of children: Not on file   Years of education: Not on file   Highest education level: Not on file  Occupational History   Not on file  Tobacco Use   Smoking status: Every Day   Smokeless tobacco: Not on file  Substance and Sexual Activity   Alcohol use: No    Comment: at [facility] for ETOH abuse   Drug use: No   Sexual activity: Not on file  Other Topics Concern   Not on file  Social History Narrative   Not on file   Social Determinants of Health   Financial Resource Strain: Not on file  Food Insecurity: Not on file  Transportation Needs: Not on file  Physical Activity: Not on file  Stress: Not on file  Social Connections: Not on file  Intimate Partner Violence: Not on file    Review of Systems  Constitutional: Negative.   HENT: Negative.    Eyes: Negative.   Respiratory:  Negative for shortness of breath.   Cardiovascular:  Negative for chest pain.  Gastrointestinal: Negative.   Genitourinary: Negative.   Musculoskeletal: Negative.   Skin:  Positive for itching and rash.  Neurological: Negative.   Endo/Heme/Allergies: Negative.   Psychiatric/Behavioral:  Negative for depression. The patient is nervous/anxious and has insomnia.         Objective    BP 129/82 (BP Location: Left Arm, Patient Position: Sitting, Cuff Size: Large)   Pulse 74   Resp 18   Ht 5\' 9"  (1.753 m)   Wt 200 lb (90.7 kg)   SpO2 97%   BMI 29.53 kg/m   Physical Exam Vitals and nursing note reviewed.  Constitutional:      Appearance: Normal appearance.  HENT:     Head: Normocephalic and atraumatic.     Right Ear: External ear normal.     Left Ear: External ear normal.     Nose: Nose normal.     Mouth/Throat:     Mouth: Mucous membranes are moist.     Pharynx: Oropharynx is clear.  Eyes:     Extraocular Movements:  Extraocular movements intact.     Conjunctiva/sclera: Conjunctivae normal.     Pupils: Pupils are equal, round, and reactive to light.  Cardiovascular:     Rate and Rhythm: Normal rate and regular rhythm.     Pulses: Normal pulses.     Heart sounds: Normal heart sounds.  Pulmonary:     Effort: Pulmonary effort is normal.     Breath sounds: Normal breath sounds.  Musculoskeletal:        General: Normal range of motion.     Cervical back: Normal range of motion and neck supple.  Skin:    General: Skin is warm and dry.     Comments: See photo  Neurological:     General: No focal deficit present.     Mental Status: He is alert and oriented to person, place, and time.  Psychiatric:        Mood and Affect: Mood normal.        Behavior: Behavior normal.        Thought Content: Thought content normal.        Judgment: Judgment normal.    Verbal consent given by patient to have photo included in chart     Assessment & Plan:   Problem List Items Addressed This Visit   None Visit Diagnoses     Psychophysiological insomnia    -  Primary   Relevant Medications   QUEtiapine (SEROQUEL) 50 MG tablet   Anxiousness       Tinea versicolor       Relevant Medications   ketoconazole (NIZORAL) 2 % cream   Methamphetamine abuse (HCC)       Opioid abuse (HCC)       Alcohol abuse       Heroin abuse (HCC)       Benzodiazepine abuse (HCC)          1. Psychophysiological insomnia Trial Seroquel.  Patient education given on supportive care.  Patient strongly encouraged to follow-up with medical provider in 2 to 3 weeks.  Patient understands and agrees. - QUEtiapine (SEROQUEL) 50 MG tablet; Take 1 tablet (50 mg total) by mouth at bedtime.  Dispense: 30 tablet; Refill: 1  2. Anxiousness Trial of Seroquel and improving sleep hygiene should improve anxiousness  3. Tinea versicolor Trial ketoconazole.  Patient education given on supportive care - ketoconazole (NIZORAL) 2 % cream; Apply 1  Application topically daily.  Dispense: 60 g; Refill: 0  4. Methamphetamine abuse (HCC) Currently in substance abuse treatment program  5. Opioid abuse (HCC)   6. Alcohol  abuse   7. Heroin abuse (HCC)   8. Benzodiazepine abuse (HCC)    I have reviewed the patient's medical history (PMH, PSH, Social History, Family History, Medications, and allergies) , and have been updated if relevant. I spent 30 minutes reviewing chart and  face to face time with patient.    Return if symptoms worsen or fail to improve.   Kasandra Knudsen Mayers, PA-C

## 2022-07-11 NOTE — Patient Instructions (Addendum)
You are going to use ketoconazole on your rash once daily until resolved.  You are going to start taking Seroquel 50 mg at bedtime.  I strongly encourage you to follow-up with a medical provider in 2 weeks for further evaluation.  Roney Jaffe, PA-C Physician Assistant [facility] [email or web address]   Tinea Versicolor  Tinea versicolor is a common fungal infection. It causes a rash that looks like light or dark patches on the skin. The rash most often occurs on the chest, back, neck, or upper arms. This condition is more common during warm weather. Tinea versicolor usually does not cause any other problems than the rash. In most cases, the infection goes away in a few weeks with treatment. It may take a few months for the patches on your skin to return to your usual skin color. What are the causes? This condition occurs when a certain type of fungus (Malassezia furfur) that is normally present on the skin starts to grow too much. This fungus is a type of yeast. This condition cannot be passed from one person to another (is not contagious). What increases the risk? This condition is more likely to develop when certain factors are present, such as: Heat and humidity. Sweating too much. Hormone changes, such as those that occur when taking birth control pills. Oily skin. A weak disease-fighting system (immunesystem). What are the signs or symptoms? Symptoms of this condition include: A rash of light or dark patches on your skin. The rash may have: Patches of tan or pink spots (on light skin). Patches of white or brown spots (on dark skin). Patches of skin that do not tan. Well-marked edges. Scales on the discolored areas. Mild itching. There may also be no itching. How is this diagnosed? A health care provider can usually diagnose this condition by looking at your skin. During the exam, he or she may use ultraviolet (UV) light  to see how much of your skin has been affected. In some cases, a skin sample may be taken by scraping the rash. This sample will be viewed under a microscope to check for yeast overgrowth. How is this treated? Treatment for this condition may include: Dandruff shampoo that is applied to the affected skin during showers or bathing. Over-the-counter medicated skin cream, lotion, or soaps. Prescription antifungal medicine in the form of skin cream or pills. Medicine to help reduce itching. Follow these instructions at home: Use over-the-counter and prescription medicines only as told by your health care provider. Apply dandruff shampoo to the affected area as told by your health care provider. Do not scratch the affected area of skin. Avoid hot and humid conditions. Do not use tanning booths. Try to avoid sweating a lot. Contact a health care provider if: Your symptoms get worse. You have a fever. You have signs of infection such as: Redness, swelling, or pain at the site of your rash. Warmth coming from your rash. Fluid or blood coming from your rash. Pus or a bad smell coming from your rash. Your rash comes back (recurs) after treatment. Your rash does not improve with treatment and spreads to other parts of the body. Summary Tinea versicolor is a common fungal infection of the skin. It causes a rash that looks like light or dark patches on the skin. The rash most often occurs on the chest, back, neck, or upper arms. A health care provider can usually diagnose this condition by looking at your skin. Treatment may include applying shampoo to  the skin and taking or applying medicines. This information is not intended to replace advice given to you by your health care provider. Make sure you discuss any questions you have with your health care provider. Document Revised: 01/19/2021 Document Reviewed: 01/19/2021 Elsevier Patient Education  2023 [employer].

## 2022-07-13 ENCOUNTER — Encounter: Payer: Self-pay | Admitting: Physician Assistant

## 2022-11-06 ENCOUNTER — Encounter (HOSPITAL_BASED_OUTPATIENT_CLINIC_OR_DEPARTMENT_OTHER): Payer: Self-pay | Admitting: Emergency Medicine

## 2022-11-06 ENCOUNTER — Emergency Department (HOSPITAL_BASED_OUTPATIENT_CLINIC_OR_DEPARTMENT_OTHER)
Admission: EM | Admit: 2022-11-06 | Discharge: 2022-11-06 | Discharge status: Against Medical Advice | Payer: Self-pay | Attending: Emergency Medicine | Admitting: Emergency Medicine

## 2022-11-06 ENCOUNTER — Other Ambulatory Visit: Payer: Self-pay

## 2022-11-06 DIAGNOSIS — R369 Urethral discharge, unspecified: Secondary | ICD-10-CM | POA: Insufficient documentation

## 2022-11-06 DIAGNOSIS — R300 Dysuria: Secondary | ICD-10-CM | POA: Insufficient documentation

## 2022-11-06 DIAGNOSIS — Z5321 Procedure and treatment not carried out due to patient leaving prior to being seen by health care provider: Secondary | ICD-10-CM | POA: Insufficient documentation

## 2022-11-06 DIAGNOSIS — R1030 Lower abdominal pain, unspecified: Secondary | ICD-10-CM | POA: Insufficient documentation

## 2022-11-06 NOTE — ED Notes (Signed)
Called in [place], 3rd call no answer

## 2022-11-06 NOTE — ED Triage Notes (Addendum)
Pt reports, " I think I have Urethritis." Symptom onset last night include burning with urination and penile discharge. Pt reports lost 10 lbs in past 2 weeks.

## 2022-11-06 NOTE — ED Notes (Signed)
Called x 1 in [place], no answer 

## 2022-11-06 NOTE — ED Notes (Signed)
Called in [place], no answer

## 2022-11-08 ENCOUNTER — Ambulatory Visit
Admission: EM | Admit: 2022-11-08 | Discharge: 2022-11-08 | Disposition: A | Discharge status: Home / Self Care | Payer: Self-pay | Attending: Emergency Medicine | Admitting: Emergency Medicine

## 2022-11-08 DIAGNOSIS — R369 Urethral discharge, unspecified: Secondary | ICD-10-CM | POA: Insufficient documentation

## 2022-11-08 DIAGNOSIS — Z9189 Other specified personal risk factors, not elsewhere classified: Secondary | ICD-10-CM | POA: Insufficient documentation

## 2022-11-08 DIAGNOSIS — R300 Dysuria: Secondary | ICD-10-CM | POA: Insufficient documentation

## 2022-11-08 DIAGNOSIS — N342 Other urethritis: Secondary | ICD-10-CM | POA: Insufficient documentation

## 2022-11-08 LAB — POCT URINALYSIS DIP (MANUAL ENTRY)
Bilirubin, UA: NEGATIVE
Glucose, UA: NEGATIVE mg/dL
Ketones, POC UA: NEGATIVE mg/dL
Nitrite, UA: NEGATIVE
Protein Ur, POC: 30 mg/dL — AB
Spec Grav, UA: 1.02 (ref 1.010–1.025)
Urobilinogen, UA: 0.2 E.U./dL
pH, UA: 8.5 — AB (ref 5.0–8.0)

## 2022-11-08 MED ORDER — DOXYCYCLINE HYCLATE 100 MG PO CAPS: 100.0000 mg | ORAL_CAPSULE | Freq: Two times a day (BID) | ORAL | 0 refills | Status: AC

## 2022-11-08 MED ORDER — CEFTRIAXONE SODIUM 500 MG IJ SOLR
500.0000 mg | INTRAMUSCULAR | Status: DC
  Administered 2022-11-08: 500 mg via INTRAMUSCULAR

## 2022-11-08 NOTE — ED Triage Notes (Signed)
Pt present burning sensation with penile discharge. Symptoms started two days ago.
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
## 2022-11-08 NOTE — Discharge Instructions (Signed)
The results of your urinalysis today are concerning for urethral infection, also commonly called urethritis.  There were white blood cells present in your urine which means your body is attempting to find an infection and you also had an abnormally high urine pH which is also suspicious for presence of bacteria causing infection.  Based on CDC guidelines, I recommend we treat you empirically for gonorrhea and chlamydia now while we wait for the results of your penile swab test.  Based on the symptoms and concerns you shared with me today, you were treated for presumed gonorrhea with an injection of ceftriaxone 500 mg presumed chlamydia with a prescription for doxycycline, 1 tablet twice daily for the next 7 days.  Please take all tablets as prescribed, do not skip doses.  Failure to take all doses as prescribed can result in a worsening infection that will be more difficult to treat and resolve.  Please abstain from sexual intercourse of any kind, vaginal, oral or anal, for 7 days.   The results of your STD testing today which tests for gonorrhea, chlamydia and trichomonas will be posted to your MyChart account in the next 3 to 5 days.  If any of your results are abnormal, you will receive a phone call regarding further treatment.  Additional prescriptions, if any are needed, will be provided for you at your pharmacy.   Please abstain from sexual intercourse of any kind, vaginal, oral or anal, until until you have received the results of your STD testing.   To be complete, we will also culture your urine to evaluate for presence of other, non-STD, bacteria such as staph or strep which could also easily have been transmitted through oral sex.  If your urine culture is positive for non-STD bacteria, you will be contacted by phone and an antibiotic to specifically treat that particular bacteria will be prescribed for you.  If you have not had complete resolution of your symptoms after completing any needed  treatment, please return for repeat evaluation.   Thank you for visiting urgent care today.  I appreciate the opportunity to participate in your care.

## 2022-11-09 LAB — CYTOLOGY, (ORAL, ANAL, URETHRAL) ANCILLARY ONLY
Chlamydia: POSITIVE — AB
Comment: NEGATIVE
Comment: NEGATIVE
Comment: NORMAL
Neisseria Gonorrhea: POSITIVE — AB
Trichomonas: NEGATIVE

## 2022-11-09 LAB — URINE CULTURE: Culture: NO GROWTH
